# Patient Record
Sex: Male | Born: 2000 | Race: Black or African American | Hispanic: No | Marital: Single | Smoking: Never smoker
Health system: Southern US, Community
[De-identification: ages and names within clinical notes are randomized; demographics above are authoritative.]

## PROBLEM LIST (undated history)

## (undated) DIAGNOSIS — Z8709 Personal history of other diseases of the respiratory system: Secondary | ICD-10-CM

## (undated) DIAGNOSIS — R0981 Nasal congestion: Secondary | ICD-10-CM

## (undated) DIAGNOSIS — Z969 Presence of functional implant, unspecified: Secondary | ICD-10-CM

---
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
## 2021-02-04 ENCOUNTER — Encounter: Payer: Self-pay | Admitting: Family Medicine

## 2021-02-04 ENCOUNTER — Other Ambulatory Visit: Payer: Self-pay

## 2021-02-04 ENCOUNTER — Ambulatory Visit (INDEPENDENT_AMBULATORY_CARE_PROVIDER_SITE_OTHER): Payer: Medicaid Other | Admitting: Family Medicine

## 2021-02-04 VITALS — BP 128/62 | HR 76 | Ht 69.88 in | Wt 270.0 lb

## 2021-02-04 DIAGNOSIS — Z7689 Persons encountering health services in other specified circumstances: Secondary | ICD-10-CM

## 2021-02-04 DIAGNOSIS — Z1159 Encounter for screening for other viral diseases: Secondary | ICD-10-CM | POA: Diagnosis not present

## 2021-02-04 DIAGNOSIS — L989 Disorder of the skin and subcutaneous tissue, unspecified: Secondary | ICD-10-CM | POA: Diagnosis not present

## 2021-02-04 DIAGNOSIS — Z114 Encounter for screening for human immunodeficiency virus [HIV]: Secondary | ICD-10-CM

## 2021-02-04 NOTE — Progress Notes (Signed)
    SUBJECTIVE:   CHIEF COMPLAINT / HPI:   Establish care   Current concerns  Patient reports he has had this rash ongoing for 3 to 4 months.  It hurts, itches.  He takes ibuprofen and lotion to help with the pain but not resolving.  It initially started as pustular, pustule popped.   PMH  Asthma   PSH  Ankle surgery when 15   Allergies  NKDA   Family Hx  HTN in mothers side  No cancers  Maternal side with diabetes   Social  Lives in [place]. Works at [employer] center for [employer] Marijuana a couple times per day, no tabacco. Occasional EtOH. Is sexually active and uses protection most of the time. No hx of STI   OBJECTIVE:   BP 128/62   Pulse 76   Ht 5' 9.88" (1.775 m)   Wt 270 lb (122.5 kg)   SpO2 98%   BMI 38.87 kg/m   General: Well-appearing 20 year old male Cardiac: Regular rate and rhythm, no murmurs appreciated  respiratory: Normal breathing, lungs clear to auscultation bilaterally Abdomen: Soft, nontender, positive bowel sounds Derm: Patient has erythematous scaling lesions covering both lower extremities, wounds in different stages of healing, no lesions on palms or soles of feet.          ASSESSMENT/PLAN:   Skin lesions Patient with numerous skin lesions to the lower extremity.  Unclear etiology.  Present for 3-4 months.  Initially started out as pustules which then popped.  Do not appear infectious in origin.  No erythema or drainage noted at this time.  I have scheduled with Dr. Lum Babe for further evaluation with punch biopsy and treatment of this issue.  We will follow-up with me later date.  Encounter to establish care with new doctor Doing well, only concern is leg lesions.  Needs hepatitis and HIV screening.  This is collected today.     Derrel Nip, MD [facility]

## 2021-02-04 NOTE — Patient Instructions (Addendum)
It was a pleasure meeting you today!  I am sorry you are having these issues with your legs.  We have scheduled you with one of our providers who is going to collect a punch biopsy of that and will be able to better determine what is causing the symptoms.  The appointment is scheduled for next week.  If your symptoms worsen, you start to have fever, the wounds become inflamed, warm, worsening pain please seek medical attention immediately.  I hope you have a wonderful afternoon!

## 2021-02-05 LAB — HIV ANTIBODY (ROUTINE TESTING W REFLEX): HIV Screen 4th Generation wRfx: NONREACTIVE

## 2021-02-05 LAB — HEPATITIS C ANTIBODY: Hep C Virus Ab: <0.1 s/co ratio (ref 0.0–0.9)

## 2021-02-07 DIAGNOSIS — L989 Disorder of the skin and subcutaneous tissue, unspecified: Secondary | ICD-10-CM | POA: Insufficient documentation

## 2021-02-07 DIAGNOSIS — Z7689 Persons encountering health services in other specified circumstances: Secondary | ICD-10-CM | POA: Insufficient documentation

## 2021-02-07 NOTE — Assessment & Plan Note (Signed)
Patient with numerous skin lesions to the lower extremity.  Unclear etiology.  Present for 3-4 months.  Initially started out as pustules which then popped.  Do not appear infectious in origin.  No erythema or drainage noted at this time.  I have scheduled with Dr. Lum Babe for further evaluation with punch biopsy and treatment of this issue.  We will follow-up with me later date.

## 2021-02-07 NOTE — Assessment & Plan Note (Signed)
Doing well, only concern is leg lesions.  Needs hepatitis and HIV screening.  This is collected today.

## 2021-02-15 ENCOUNTER — Other Ambulatory Visit: Payer: Self-pay

## 2021-02-15 ENCOUNTER — Ambulatory Visit (INDEPENDENT_AMBULATORY_CARE_PROVIDER_SITE_OTHER): Payer: Medicaid Other | Admitting: Family Medicine

## 2021-02-15 ENCOUNTER — Encounter: Payer: Self-pay | Admitting: Family Medicine

## 2021-02-15 VITALS — BP 130/94 | HR 66 | Ht 69.0 in | Wt 260.2 lb

## 2021-02-15 DIAGNOSIS — R030 Elevated blood-pressure reading, without diagnosis of hypertension: Secondary | ICD-10-CM

## 2021-02-15 DIAGNOSIS — Z113 Encounter for screening for infections with a predominantly sexual mode of transmission: Secondary | ICD-10-CM

## 2021-02-15 DIAGNOSIS — L282 Other prurigo: Secondary | ICD-10-CM | POA: Diagnosis not present

## 2021-02-15 DIAGNOSIS — L298 Other pruritus: Secondary | ICD-10-CM

## 2021-02-15 MED ORDER — BETAMETHASONE VALERATE 0.1 % EX OINT: 1.0000 "application " | TOPICAL_OINTMENT | Freq: Two times a day (BID) | CUTANEOUS | 0 refills | Status: DC

## 2021-02-15 MED ORDER — HYDROXYZINE HCL 10 MG PO TABS: 10.0000 mg | ORAL_TABLET | Freq: Three times a day (TID) | ORAL | 0 refills | Status: AC | PRN

## 2021-02-15 NOTE — Progress Notes (Signed)
    SUBJECTIVE:   CHIEF COMPLAINT / HPI:   Rash This is a chronic (Rash on both leg started 5 months ago) problem. The rash is characterized by itchiness (Rash started like a pimples 5 months ago. He it worsen from him messing with the bumbs). Associated with: No new exposures. Treatments tried: Vaseline and steroid cream. The treatment provided no relief.   Elevated BP: No diagnosis of HTN. No concerns today.  PERTINENT  PMH / PSH: PMX reviewed  OBJECTIVE:   Vitals:   02/15/21 0917  BP: (!) 130/94  Pulse: 66  Weight: 260 lb 3.2 oz (118 kg)  Height: 5\' 9"  (1.753 m)    Physical Exam Vitals reviewed.  Cardiovascular:     Rate and Rhythm: Normal rate and regular rhythm.     Heart sounds: Normal heart sounds. No murmur heard. Pulmonary:     Effort: Pulmonary effort is normal. No respiratory distress.     Breath sounds: Normal breath sounds. No wheezing.  Musculoskeletal:     Comments: Diffused , hyperpigmented papular lesions in different phases of healing on his LL B/L.     ASSESSMENT/PLAN:   Chronic papular urticaria Biopsy obtained today to r/o other causes. I discussed avoiding touching and irritating these lesions to allow time to heal. Topical betamethasone prescribed as well as Hydroxyzine prn itching. F/U in 2 weeks for reassessment.  Elevated blood pressure reading Previously elevated in the past. BP was not rechecked during this visit. I discussed monitoring and f/u in about 2 weeks for reassessment. F/U appointment made. He was appreciative of this.    , MD [facility]

## 2021-02-15 NOTE — Assessment & Plan Note (Signed)
Previously elevated in the past. BP was not rechecked during this visit. I discussed monitoring and f/u in about 2 weeks for reassessment. F/U appointment made. He was appreciative of this.

## 2021-02-15 NOTE — Assessment & Plan Note (Signed)
Biopsy obtained today to r/o other causes. I discussed avoiding touching and irritating these lesions to allow time to heal. Topical betamethasone prescribed as well as Hydroxyzine prn itching. F/U in 2 weeks for reassessment.

## 2021-02-15 NOTE — Patient Instructions (Signed)
Wound Care, Adult Taking care of your wound properly can help to prevent pain, infection, and scarring. It can also help your wound heal more quickly. Follow instructionsfrom your health care provider about how to care for your wound. Supplies needed: Soap and water. Wound cleanser. Gauze. If needed, a clean bandage (dressing) or other type of wound dressing material to cover or place in the wound. Follow your health care provider's instructions about what dressing supplies to use. Cream or ointment to apply to the wound, if told by your health care provider. How to care for your wound Cleaning the wound Ask your health care provider how to clean the wound. This may include: Using mild soap and water or a wound cleanser. Using a clean gauze to pat the wound dry after cleaning it. Do not rub or scrub the wound. Dressing care Wash your hands with soap and water for at least 20 seconds before and after you change the dressing. If soap and water are not available, use hand sanitizer. Change your dressing as told by your health care provider. This may include: Cleaning or rinsing out (irrigating) the wound. Placing a dressing over the wound or in the wound (packing). Covering the wound with an outer dressing. Leave any stitches (sutures), skin glue, or adhesive strips in place. These skin closures may need to stay in place for 2 weeks or longer. If adhesive strip edges start to loosen and curl up, you may trim the loose edges. Do not remove adhesive strips completely unless your health care provider tells you to do that. Ask your health care provider when you can leave the wound uncovered. Checking for infection Check your wound area every day for signs of infection. Check for: More redness, swelling, or pain. Fluid or blood. Warmth. Pus or a bad smell.  Follow these instructions at home Medicines If you were prescribed an antibiotic medicine, cream, or ointment, take or apply it as told by  your health care provider. Do not stop using the antibiotic even if your condition improves. If you were prescribed pain medicine, take it 30 minutes before you do any wound care or as told by your health care provider. Take over-the-counter and prescription medicines only as told by your health care provider. Eating and drinking Eat a diet that includes protein, vitamin A, vitamin C, and other nutrient-rich foods to help the wound heal. Foods rich in protein include meat, fish, eggs, dairy, beans, and nuts. Foods rich in vitamin A include carrots and dark green, leafy vegetables. Foods rich in vitamin C include citrus fruits, tomatoes, broccoli, and peppers. Drink enough fluid to keep your urine pale yellow. General instructions Do not take baths, swim, use a hot tub, or do anything that would put the wound underwater until your health care provider approves. Ask your health care provider if you may take showers. You may only be allowed to take sponge baths. Do not scratch or pick at the wound. Keep it covered as told by your health care provider. Return to your normal activities as told by your health care provider. Ask your health care provider what activities are safe for you. Protect your wound from the sun when you are outside for the first 6 months, or for as long as told by your health care provider. Cover up the scar area or apply sunscreen that has an SPF of at least 30. Do not use any products that contain nicotine or tobacco, such as cigarettes, e-cigarettes, and chewing tobacco.   These may delay wound healing. If you need help quitting, ask your health care provider. Keep all follow-up visits as told by your health care provider. This is important. Contact a health care provider if: You received a tetanus shot and you have swelling, severe pain, redness, or bleeding at the injection site. Your pain is not controlled with medicine. You have any of these signs of infection: More  redness, swelling, or pain around the wound. Fluid or blood coming from the wound. Warmth coming from the wound. Pus or a bad smell coming from the wound. A fever or chills. You are nauseous or you vomit. You are dizzy. Get help right away if: You have a red streak of skin near the area around your wound. Your wound has been closed with staples, sutures, skin glue, or adhesive strips and it begins to open up and separate. Your wound is bleeding, and the bleeding does not stop with gentle pressure. You have a rash. You faint. You have trouble breathing. These symptoms may represent a serious problem that is an emergency. Do not wait to see if the symptoms will go away. Get medical help right away. Call your local emergency services (911 in the [place] Do not drive yourself to the hospital. Summary Always wash your hands with soap and water for at least 20 seconds before and after changing your dressing. Change your dressing as told by your health care provider. To help with healing, eat foods that are rich in protein, vitamin A, vitamin C, and other nutrients. Check your wound every day for signs of infection. Contact your health care provider if you suspect that your wound is infected. This information is not intended to replace advice given to you by your health care provider. Make sure you discuss any questions you have with your healthcare provider. Document Revised: 03/07/2019 Document Reviewed: 03/07/2019 Elsevier Patient Education  2022 [employer]  

## 2021-02-16 ENCOUNTER — Telehealth: Payer: Self-pay | Admitting: Family Medicine

## 2021-02-16 NOTE — Telephone Encounter (Signed)
Skin biopsy result discussed.  Likely contact vs atopic vs nummular vs seborrheic dermatitis.  Already on topical steroid and antihistamine prn itching.  F/U in 1-2 weeks for reassessment. Consider oral A/B if no improvement and high potency steroid given element of inflammation of epidermoid cyst.  Return precaution discussed.

## 2021-02-25 ENCOUNTER — Other Ambulatory Visit: Payer: Self-pay | Admitting: Family Medicine

## 2021-03-01 ENCOUNTER — Encounter: Payer: Self-pay | Admitting: Family Medicine

## 2021-03-01 ENCOUNTER — Ambulatory Visit (INDEPENDENT_AMBULATORY_CARE_PROVIDER_SITE_OTHER): Payer: Medicaid Other | Admitting: Family Medicine

## 2021-03-01 ENCOUNTER — Other Ambulatory Visit: Payer: Self-pay

## 2021-03-01 DIAGNOSIS — L83 Acanthosis nigricans: Secondary | ICD-10-CM

## 2021-03-01 DIAGNOSIS — L308 Other specified dermatitis: Secondary | ICD-10-CM | POA: Diagnosis not present

## 2021-03-01 DIAGNOSIS — R030 Elevated blood-pressure reading, without diagnosis of hypertension: Secondary | ICD-10-CM | POA: Diagnosis not present

## 2021-03-01 MED ORDER — BETAMETHASONE VALERATE 0.1 % EX OINT: TOPICAL_OINTMENT | CUTANEOUS | 1 refills | Status: DC

## 2021-03-01 NOTE — Assessment & Plan Note (Signed)
BP still elevated. Repeat checked by me improved after a few minutes of rest. I reached out to Tanner Blake and discussed patient with her for ambulatory BP monitoring during this visit. Dr. Nicholaus Bloom will reach out to him to schedule an appointment - he is aware of this. Diet and exercise counseling completed. Continue monitoring with PCP. He does not have a home BP machine. May obtained at the pharmacy.

## 2021-03-01 NOTE — Assessment & Plan Note (Addendum)
Recent biopsy result reviewed and it showed chronic spongiotic process. Differential include contact, nummular or atopic dermatitis. Good response to medium potency steroid. I refilled his Betamethasone cream. F/U as needed.

## 2021-03-01 NOTE — Progress Notes (Signed)
    SUBJECTIVE:   CHIEF COMPLAINT / HPI:   Rash: Here to follow-up for B/L leg skin lesions which improved a lot on his steroid cream. He ran out of the cream a few days ago and it seems his itching and pain are returning. He also c/o rash on the back of his neck since childhood. It does not bother him, unless he self irritate it. No other concerns.   Elevated BP: Here fr follow-up. He does not check his BP at home.  PERTINENT  PMH / PSH: PMX reviewed  OBJECTIVE:   Vitals:   03/01/21 0910 03/01/21 0924  BP: (!) 152/69 (!) 144/97  Pulse: 62   SpO2: 100%   Weight: 261 lb (118.4 kg)   Height: 5\' 9"  (1.753 m)     Physical Exam Vitals and nursing note reviewed.  Constitutional:      Appearance: He is obese.  Neck:     Comments: Hyperpigmented lesion around his neck, darker on the back of his neck Cardiovascular:     Rate and Rhythm: Normal rate and regular rhythm.     Heart sounds: Normal heart sounds. No murmur heard. Pulmonary:     Effort: Pulmonary effort is normal. No respiratory distress.     Breath sounds: Normal breath sounds. No wheezing.  Skin:    Comments: Hyperpigmented papular lesions on his legs B/L improved significantly from previous visits.  Neurological:     Mental Status: He is alert.          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 Differential include contact, nummular or atopic dermatitis. Good response to medium potency steroid. I refilled his Betamethasone cream. F/U as needed.  Acanthosis This was inadvertently not addressed during the visit as initially planned. I called him after visit and discussed the skin condition with him and need to screen for DM. He stated that his grandmother has DM2. I help him make an appointment with his PCP for A1C and Bmet check. He was appreciative of the call.  Elevated blood pressure reading BP still  elevated. Repeat checked by me improved after a few minutes of rest. I reached out to and discussed patient with her for ambulatory BP monitoring during this visit. Dr. Ocie Cornfield will reach out to him to schedule an appointment - he is aware of this. Diet and exercise counseling completed. Continue monitoring with PCP. He does not have a home BP machine. May obtained at the pharmacy.   More than 50% of this 35 minutes appointment was spent on counseling, result review, coordination of care and post visit telephone encounter  Nicholaus Bloom, MD [facility]

## 2021-03-01 NOTE — Assessment & Plan Note (Signed)
This was inadvertently not addressed during the visit as initially planned. I called him after visit and discussed the skin condition with him and need to screen for DM. He stated that his grandmother has DM2. I help him make an appointment with his PCP for A1C and Bmet check. He was appreciative of the call.

## 2021-03-01 NOTE — Patient Instructions (Signed)

## 2021-03-17 ENCOUNTER — Other Ambulatory Visit: Payer: Self-pay

## 2021-03-17 ENCOUNTER — Ambulatory Visit (INDEPENDENT_AMBULATORY_CARE_PROVIDER_SITE_OTHER): Payer: PRIVATE HEALTH INSURANCE | Admitting: Family Medicine

## 2021-03-17 VITALS — BP 122/71 | HR 72 | Ht 69.0 in | Wt 262.0 lb

## 2021-03-17 DIAGNOSIS — E669 Obesity, unspecified: Secondary | ICD-10-CM

## 2021-03-17 DIAGNOSIS — L83 Acanthosis nigricans: Secondary | ICD-10-CM | POA: Diagnosis not present

## 2021-03-17 DIAGNOSIS — R030 Elevated blood-pressure reading, without diagnosis of hypertension: Secondary | ICD-10-CM

## 2021-03-17 LAB — POCT GLYCOSYLATED HEMOGLOBIN (HGB A1C): Hemoglobin A1C: 5.7 % — AB (ref 4.0–5.6)

## 2021-03-17 MED ORDER — BETAMETHASONE VALERATE 0.1 % EX OINT: TOPICAL_OINTMENT | CUTANEOUS | 1 refills | Status: DC

## 2021-03-17 NOTE — Patient Instructions (Signed)
It was great seeing you today.  Your blood pressure looks much better today than it did at the previous visit.  I am going to check some lab work on you today.  We did check a hemoglobin A1c which is a marker for diabetes and it was mildly elevated.  You have to have multiple to technically be diagnosed with prediabetes but if you continue to make her dietary changes and weight loss we will recheck in a few months and hopefully will have come down.  Please let me know if you have any questions or concerns.  I hope you have a wonderful afternoon!

## 2021-03-17 NOTE — Progress Notes (Signed)
    SUBJECTIVE:   CHIEF COMPLAINT / HPI:   Acanthosis [place]  Patient has darkening of his skin around his neck, in his armpits as well as the creases of his elbows.  He reports that he used to weigh 330 pounds and reports this was due to inactivity and poor dietary habits.  He broke his ankle and could not work.  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  Denies any urinary frequency.  Denies any signs of hyperglycemia or hypoglycemia.  Wants to continue to lose weight.  Blood pressure check Patient was recently seen in our clinic for dermatologic issues but had elevated blood pressures.  He was asked to return for repeat blood pressure check.  He denies any headaches, chest pain, shortness of breath.  He is not on any medications at this time.  OBJECTIVE:   BP 122/71   Pulse 72   Ht 5\' 9"  (1.753 m)   Wt 262 lb (118.8 kg)   SpO2 100%   BMI 38.69 kg/m   General: Well-appearing obese 20 year old male, no acute distress Cardiac: Regular rate and rhythm, no murmurs appreciated Respiratory: Normal for breathing, lungs clear to auscultation laterally Abdomen: Soft, nontender, positive bowel sounds MSK: No gross abnormalities Derm: Skin darkening creases of skin of axilla as well as around the neck  ASSESSMENT/PLAN:   Elevated blood pressure reading Repeat blood pressure today improved at 122/71.  He is planning on having ambulatory blood pressure monitoring set up by our pharmacy team.  We will continue to monitor and not initiate medications at this time.  BMP collected today.  Acanthosis Patient with family history of diabetes and is obese.  Has been losing weight.  A1c today was 5.7.  Discussed healthy dietary changes and patient will work on these.  Continue to monitor.     35, MD [facility]

## 2021-03-18 LAB — BASIC METABOLIC PANEL
BUN/Creatinine Ratio: 11 (ref 9–20)
BUN: 11 mg/dL (ref 6–20)
CO2: 23 mmol/L (ref 20–29)
Calcium: 9.6 mg/dL (ref 8.7–10.2)
Chloride: 101 mmol/L (ref 96–106)
Creatinine, Ser: 0.99 mg/dL (ref 0.76–1.27)
Glucose: 93 mg/dL (ref 70–99)
Potassium: 4.9 mmol/L (ref 3.5–5.2)
Sodium: 140 mmol/L (ref 134–144)
eGFR: 111 mL/min/{1.73_m2} (ref >59)

## 2021-03-18 LAB — TSH: TSH: 0.823 u[IU]/mL (ref 0.450–4.500)

## 2021-03-18 NOTE — Assessment & Plan Note (Signed)
Repeat blood pressure today improved at 122/71.  He is planning on having ambulatory blood pressure monitoring set up by our pharmacy team.  We will continue to monitor and not initiate medications at this time.  BMP collected today.

## 2021-03-18 NOTE — Assessment & Plan Note (Signed)
Patient with family history of diabetes and is obese.  Has been losing weight.  A1c today was 5.7.  Discussed healthy dietary changes and patient will work on these.  Continue to monitor.

## 2021-03-23 ENCOUNTER — Ambulatory Visit (INDEPENDENT_AMBULATORY_CARE_PROVIDER_SITE_OTHER): Payer: PRIVATE HEALTH INSURANCE | Admitting: Pharmacist

## 2021-03-23 ENCOUNTER — Other Ambulatory Visit: Payer: Self-pay

## 2021-03-23 VITALS — BP 145/62

## 2021-03-23 DIAGNOSIS — R030 Elevated blood-pressure reading, without diagnosis of hypertension: Secondary | ICD-10-CM

## 2021-03-23 NOTE — Progress Notes (Signed)
   S:    Patient arrives in good spirits.   Presents to the clinic for ambulatory blood pressure evaluation.   Patient was referred on 03/01/21.  Patient was last seen by Primary Care Provider on 03/17/21.   Patient has no history of hypertension but has had recent elevations in office blood pressure readings.  Medication compliance is reported to be optimal.  Discussed procedure for wearing the monitor and gave patient written instructions. Monitor was placed on non-dominant arm with instructions to return in the morning.   Current BP Medications include:  none  O:  Physical Exam Neurological:     Mental Status: He is alert and oriented to person, place, and time.  ROS  Last 3 Office BP readings: BP Readings from Last 3 Encounters:  03/23/21 (!) 145/62  03/17/21 122/71  03/01/21 (!) 144/97    Clinical Atherosclerotic Cardiovascular Disease (ASCVD): No  The ASCVD Risk score (Arnett DK, et al., 2019) failed to calculate for the following reasons:   The 2019 ASCVD risk score is only valid for ages 62 to 32  Basic Metabolic Panel    Component Value Date/Time   NA 140 03/17/2021 0958   K 4.9 03/17/2021 0958   CL 101 03/17/2021 0958   CO2 23 03/17/2021 0958   GLUCOSE 93 03/17/2021 0958   BUN 11 03/17/2021 0958   CREATININE 0.99 03/17/2021 0958   CALCIUM 9.6 03/17/2021 0958    Renal function: Estimated Creatinine Clearance: 150.1 mL/min (by C-G formula based on SCr of 0.99 mg/dL).  Today's Office Blood Pressure (BP) reading: 145/62 mmHg (manual reading)  ABPM Study Data: Arm Placement left arm  Overall Mean 24hr BP:   124/61 mmHg HR: 70 Daytime Mean BP:  124/60 mmHg HR: 70  Nighttime Mean BP:  130/78 mmHg HR: 72  Dipping Pattern: No.  Sys:   -4.9%  Dia:    [normal dipping ~10-20%] -31.6% Non-hypertensive ABPM thresholds: daytime BP <125/75 mmHg, sleeptime BP <120/70 mmHg   PHQ-9 Score: 0   A/P: History of elevated blood pressure found to have white coat  hypertension (blood presssure at goal). Given 24-hour ambulatory blood pressure demonstrates normal range with an average blood pressure of 124/61 mmHg, and a nocturnal dipping pattern that is abnormal  but patient reports having issues with the blood pressure cuff during the evening. Marland Kitchen     Results reviewed and written information provided.  Total time in face-to-face counseling 30 minutes.   F/U Clinic Visit with PCP as needed.

## 2021-03-23 NOTE — Patient Instructions (Addendum)
Mr. Tanner Blake. it was a pleasure seeing you today.   Your blood pressure while wearing the monitor appeared to be normal. We do not need to start any medication today.  Limiting salt intake and caffeine.  Exercising as able for at least 30 minutes for 5 days out of the week, can also help you lower your blood pressure.  Take your blood pressure at home if you are able. Please write down these numbers and bring them to your visits.  If you have any questions please call me.  Follow-up with Dr. Nobie Putnam as needed.

## 2021-03-24 ENCOUNTER — Ambulatory Visit: Payer: PRIVATE HEALTH INSURANCE | Admitting: Pharmacist

## 2021-03-24 ENCOUNTER — Other Ambulatory Visit: Payer: Self-pay

## 2021-04-11 ENCOUNTER — Other Ambulatory Visit: Payer: Self-pay | Admitting: Family Medicine

## 2021-06-13 ENCOUNTER — Encounter: Payer: Self-pay | Admitting: Family Medicine

## 2021-06-13 ENCOUNTER — Other Ambulatory Visit: Payer: Self-pay

## 2021-06-13 ENCOUNTER — Ambulatory Visit (INDEPENDENT_AMBULATORY_CARE_PROVIDER_SITE_OTHER): Payer: PRIVATE HEALTH INSURANCE | Admitting: Family Medicine

## 2021-06-13 VITALS — BP 144/87 | HR 91 | Ht 69.0 in | Wt 265.4 lb

## 2021-06-13 DIAGNOSIS — L308 Other specified dermatitis: Secondary | ICD-10-CM | POA: Diagnosis not present

## 2021-06-13 MED ORDER — DOXYCYCLINE HYCLATE 100 MG PO TABS: 100.0000 mg | ORAL_TABLET | Freq: Two times a day (BID) | ORAL | 0 refills | Status: AC

## 2021-06-13 NOTE — Progress Notes (Signed)
° ° °  SUBJECTIVE:   CHIEF COMPLAINT / HPI:   Blood pressure concerns Patient's blood pressure doing well since being seen last.  Mildly elevated today.  Was seen by pharmacy team and had ambulatory blood pressure monitoring and proven to be whitecoat hypertension.  No symptoms of hypertension acknowledged.  Leg rash Patient reports that since being seen last his leg rash has gotten worse.  He has been using the steroid cream regularly and although it does help prevent pustules from forming the rash is worsening.  Reports that his legs sometimes feel warm.  Is unclear of the cause.   OBJECTIVE:   BP (!) 144/87    Pulse 91    Ht 5\' 9"  (1.753 m)    Wt 265 lb 6.4 oz (120.4 kg)    SpO2 99%    BMI 39.19 kg/m   General: Well-appearing 21 year old male in no acute distress Cardiac: Regular rate and rhythm, no murmurs appreciated Respiratory: Normal work of breathing, speaking in full sentences Derm: Patient with worsening hyperpigmented papular lesions on his legs bilaterally.  These are considerably worse than previous evaluations.  Please see image below.       ASSESSMENT/PLAN:   Spongiotic psoriasiform dermatitis Patient has been compliant with steroid medications since previous evaluation.  It had improved but has gotten considerably worse over the past few weeks.  Patient reports he has had warmness in his legs and there is concern for folliculitis.  Prescription sent for doxycycline 100 mg twice daily and referral placed for dermatology.  Strict ED and return precautions given.     Gifford Shave, MD [place]

## 2021-06-13 NOTE — Patient Instructions (Signed)
It was great seeing you today.  I want to continue using the creams that you have been using.  I am starting you on an antibiotic called doxycycline which you will take twice daily for 14 days.  I would like for you to be seen in our clinic after that.  I have also placed a referral for dermatology but this may take some time for you to get the appointment scheduled.  If you notice any worsening of symptoms, fever, worsening pain please be evaluated immediately.  I hope you have a great afternoon!

## 2021-06-14 NOTE — Assessment & Plan Note (Signed)
Patient has been compliant with steroid medications since previous evaluation.  It had improved but has gotten considerably worse over the past few weeks.  Patient reports he has had warmness in his legs and there is concern for folliculitis.  Prescription sent for doxycycline 100 mg twice daily and referral placed for dermatology.  Strict ED and return precautions given.

## 2021-06-15 ENCOUNTER — Other Ambulatory Visit: Payer: Self-pay | Admitting: Family Medicine

## 2021-06-28 ENCOUNTER — Encounter: Payer: Self-pay | Admitting: Family Medicine

## 2021-06-28 ENCOUNTER — Ambulatory Visit (INDEPENDENT_AMBULATORY_CARE_PROVIDER_SITE_OTHER): Payer: Medicaid Other | Admitting: Family Medicine

## 2021-06-28 ENCOUNTER — Other Ambulatory Visit: Payer: Self-pay

## 2021-06-28 VITALS — BP 136/70 | HR 69 | Ht 70.0 in | Wt 254.6 lb

## 2021-06-28 DIAGNOSIS — Z0000 Encounter for general adult medical examination without abnormal findings: Secondary | ICD-10-CM | POA: Diagnosis not present

## 2021-06-28 DIAGNOSIS — Z23 Encounter for immunization: Secondary | ICD-10-CM

## 2021-06-28 DIAGNOSIS — L308 Other specified dermatitis: Secondary | ICD-10-CM

## 2021-06-28 NOTE — Assessment & Plan Note (Signed)
Received HPV, flu and tdap vaccines today.

## 2021-06-28 NOTE — Progress Notes (Addendum)
° ° ° °  SUBJECTIVE:   CHIEF COMPLAINT / HPI:   Tanner Blake. is a 21 y.o. male presents for follow up   Spongiotic psoriasiform dermatitis Pt reports a bilateral rash over legs over the last 6-7 months. Punch biopsy showed spongiotic psoriasiform dermatitis on 02/15/22 and treated with steroid cream in September. His leg rash got worse in January and came back to the clinic 2 weeks ago. He was treated with doxycycline which is just finished. Symptoms are much improved in terms of swelling, redness and pain. Denies pustules, fevers etc. Upcoming appointment with dermatology in March 22.  Flowsheet Row Office Visit from 06/28/2021 in [facility]  PHQ-9 Total Score 0        Health Maintenance Due  Topic   COVID-19 Vaccine (3 - Booster for Pfizer series)   HPV VACCINES (2 - Male 3-dose series)    PERTINENT  PMH / PSH: acanthosis   OBJECTIVE:   BP 136/70    Pulse 69    Ht 5\' 10"  (1.778 m)    Wt 254 lb 9.6 oz (115.5 kg)    SpO2 99%    BMI 36.53 kg/m    General: Alert, no acute distress, pleasant  Cardio: well perfused  Pulm:  normal work of breathing Neuro: Cranial nerves grossly intact   ASSESSMENT/PLAN:    Spongiotic psoriasiform dermatitis Folliculitis rash has much improved following 2 week course of Doxycyline with resolution of patient's symptoms. There is remaining skin hyperpigmentation. Follow up with dermatology in March. Strict ER precautions provided to pt.  Health maintenance examination Received HPV, flu and tdap vaccines today.    April, MD PGY-3 [facility]

## 2021-06-28 NOTE — Patient Instructions (Signed)
Thank you for coming to see me today. It was a pleasure. Today we discussed your leg rash. It looks much better today, we do not need further antibiotics. I recommend follow up with derm in march. If you get similar symptoms then come back and see [place] and you made need antibiotics again.  Please follow-up with PCP as needed.  If you have any questions or concerns, please do not hesitate to call the office at [phone].  Best wishes,   Dr Allena Katz

## 2021-06-28 NOTE — Assessment & Plan Note (Addendum)
Folliculitis rash has much improved following 2 week course of Doxycyline with resolution of patient's symptoms. There is remaining skin hyperpigmentation. Follow up with dermatology in March. Strict ER precautions provided to pt.

## 2021-11-08 ENCOUNTER — Encounter: Payer: Self-pay | Admitting: *Deleted

## 2022-03-02 ENCOUNTER — Other Ambulatory Visit: Payer: Self-pay | Admitting: Family Medicine

## 2022-03-02 DIAGNOSIS — L308 Other specified dermatitis: Secondary | ICD-10-CM
